# Patient Record
Sex: Male | Born: 1990 | Race: White | Hispanic: No | Marital: Married | Smoking: Never smoker
Health system: Southern US, Community
[De-identification: ages and names within clinical notes are randomized; demographics above are authoritative.]

## PROBLEM LIST (undated history)

## (undated) DIAGNOSIS — Z8489 Family history of other specified conditions: Secondary | ICD-10-CM

## (undated) DIAGNOSIS — K219 Gastro-esophageal reflux disease without esophagitis: Secondary | ICD-10-CM

---
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
## 2013-04-05 ENCOUNTER — Ambulatory Visit: Payer: Self-pay | Admitting: Family Medicine

## 2016-04-24 DIAGNOSIS — E669 Obesity, unspecified: Secondary | ICD-10-CM

## 2016-04-24 DIAGNOSIS — J302 Other seasonal allergic rhinitis: Secondary | ICD-10-CM | POA: Insufficient documentation

## 2016-04-25 ENCOUNTER — Ambulatory Visit (INDEPENDENT_AMBULATORY_CARE_PROVIDER_SITE_OTHER): Payer: Managed Care, Other (non HMO) | Admitting: Family Medicine

## 2016-04-25 VITALS — BP 120/62 | HR 68 | Temp 98.5°F | Wt 221.0 lb

## 2016-04-25 DIAGNOSIS — K4090 Unilateral inguinal hernia, without obstruction or gangrene, not specified as recurrent: Secondary | ICD-10-CM | POA: Diagnosis not present

## 2016-04-25 NOTE — Progress Notes (Signed)
   Aaron Hurst.  MRN: [id number] DOB: 1991-06-15  Subjective:  HPI  The patient is a 25 year old male who presents with complaint of left groin pain.  He reports that he had been working out at [employer] and doing a lot of abdominal exercises.  He began having pain in the left groin about 2 weeks ago and he notes that there is a little lump that is reducable.  There is minor pain and occasional moderate amount of pain.    The patient is to get married in November and plans on going to [place].  Because of this he wanted to make sure everything was ok and if he needed to have anything done he could have it done in time for the wedding.  Patient Active Problem List   Diagnosis Date Noted  . Seasonal allergies 04/24/2016  . Obesity 04/24/2016    No past medical history on file.  Social History   Social History  . Marital status: Single    Spouse name: N/A  . Number of children: N/A  . Years of education: N/A   Occupational History  . Not on file.   Social History Main Topics  . Smoking status: Not on file  . Smokeless tobacco: Not on file  . Alcohol use Not on file  . Drug use: Unknown  . Sexual activity: Not on file   Other Topics Concern  . Not on file   Social History Narrative  . No narrative on file    Outpatient Medications Prior to Visit  Medication Sig Dispense Refill  . fexofenadine (ALLEGRA) 60 MG tablet Take 60 mg by mouth daily.    . Multiple Vitamin (MULTIVITAMIN) tablet Take 1 tablet by mouth daily.    . naproxen (NAPROSYN) 500 MG tablet Take 500 mg by mouth 2 (two) times daily with a meal.    . Omega-3 Fatty Acids (FISH OIL) 1000 MG CAPS Take 1 capsule by mouth daily.     No facility-administered medications prior to visit.     No Known Allergies  Review of Systems  Constitutional: Negative for fever and malaise/fatigue.  Gastrointestinal: Negative for abdominal pain, blood in stool, constipation, diarrhea, heartburn, nausea and vomiting.    Genitourinary: Negative for dysuria (patient states that the area is a little more sensitive sometimes when he urinates).  Neurological: Negative for dizziness, weakness and headaches.   Objective:  BP 120/62   Pulse 68   Temp 98.5 F (36.9 C) (Oral)   Wt 221 lb (100.2 kg)   BMI 29.97 kg/m   Physical Exam  Constitutional: He is oriented to person, place, and time and well-developed, well-nourished, and in no distress.  HENT:  Head: Normocephalic and atraumatic.  Right Ear: External ear normal.  Left Ear: External ear normal.  Nose: Nose normal.  Eyes: Conjunctivae are normal.  Cardiovascular: Normal rate, regular rhythm and normal heart sounds.   Pulmonary/Chest: Effort normal and breath sounds normal.  Abdominal: Soft.  Neurological: He is alert and oriented to person, place, and time.  Skin: Skin is warm and dry.  Psychiatric: Mood, memory, affect and judgment normal.    Assessment and Plan :   Left inguinal hernia Refer to surgery discuss options. Julieanne Manson MD [facility] 04/25/2016 4:08 PM

## 2016-04-26 ENCOUNTER — Encounter: Payer: Self-pay | Admitting: *Deleted

## 2016-05-14 ENCOUNTER — Encounter: Payer: Self-pay | Admitting: General Surgery

## 2016-05-14 ENCOUNTER — Ambulatory Visit (INDEPENDENT_AMBULATORY_CARE_PROVIDER_SITE_OTHER): Payer: Managed Care, Other (non HMO) | Admitting: General Surgery

## 2016-05-14 VITALS — BP 124/80 | HR 82 | Resp 14 | Ht 72.0 in | Wt 224.0 lb

## 2016-05-14 DIAGNOSIS — K4090 Unilateral inguinal hernia, without obstruction or gangrene, not specified as recurrent: Secondary | ICD-10-CM | POA: Diagnosis not present

## 2016-05-14 NOTE — Progress Notes (Signed)
Patient ID: Aaron PrettyStephen C Rickey Jr., male   DOB: Jan 18, 1991, 25 y.o.   MRN: [id number]  Chief Complaint  Patient presents with  . Other    left inguinal hernia    HPI Aaron PrettyStephen C Kovacik Jr. is a 25 y.o. male here today for a evaluation of a left inguinal hernia repair. He states he noticed this area about 6 weeks ago. He states the area is popping in and out. Some pain with activity. He states he was working out at [employer] when this happened.  I have reviewed the history of present illness with the patient.  HPI  History reviewed. No pertinent past medical history.  Past Surgical History:  Procedure Laterality Date  . NO PAST SURGERIES    . WISDOM TOOTH EXTRACTION  2009    Family History  Problem Relation Age of Onset  . Colon cancer Father   . Sleep apnea Father   . Heart attack Paternal Grandfather     Social History Social History  Substance Use Topics  . Smoking status: Never Smoker  . Smokeless tobacco: Never Used  . Alcohol use Yes    No Known Allergies  Current Outpatient Prescriptions  Medication Sig Dispense Refill  . ibuprofen (ADVIL,MOTRIN) 100 MG tablet Take 100 mg by mouth every 6 (six) hours as needed for fever.    Marland Kitchen. MELATON-5 HTP-TRYPTOPHAN-B6-MG PO Take by mouth.    . Multiple Vitamins-Minerals (MULTIVITAMIN WITH MINERALS) tablet Take 1 tablet by mouth daily.    . Omega-3 Fatty Acids (FISH OIL) 1000 MG CAPS Take by mouth.     No current facility-administered medications for this visit.     Review of Systems Review of Systems  Constitutional: Negative.   Respiratory: Negative.   Cardiovascular: Negative.     Blood pressure 124/80, pulse 82, resp. rate 14, height 6' (1.829 m), weight 224 lb (101.6 kg).  Physical Exam Physical Exam  Constitutional: He is oriented to person, place, and time. He appears well-developed and well-nourished.  Eyes: Conjunctivae are normal.  Neck: Neck supple.  Cardiovascular: Normal rate, regular rhythm and normal heart  sounds.   Pulmonary/Chest: Effort normal and breath sounds normal.  Abdominal: Soft. Normal appearance and bowel sounds are normal. There is no hepatomegaly. There is no tenderness. A hernia is present. Hernia confirmed positive in the left inguinal area (medium sized reducible hernia). Hernia confirmed negative in the right inguinal area.  Lymphadenopathy:    He has no cervical adenopathy.  Neurological: He is alert and oriented to person, place, and time.  Skin: Skin is warm and dry.    Data Reviewed Notes reviewed   Assessment    Left inguinal henria    Plan   Recommended repair. Explained both open and laparoscopic approaches and use of mesh. He is agreeable. Plan for laparoscopic procedure    Hernia precautions and incarceration were discussed with the patient. If they develop symptoms of an/ incarcerated hernia, they were encouraged to seek prompt medical attention.  I have recommended repair of the hernia using mesh on an outpatient basis in the near future. The risk of infection was reviewed. The role of prosthetic mesh to minimize the risk of recurrence was reviewed.  The patient is scheduled for surgery at [facility] on 05/30/16. He will pre admit by phone. The patient is aware of date and instructions.   This information has been scribed by Ples SpecterJessica Qualls CMA.   SANKAR,SEEPLAPUTHUR G 05/14/2016, 4:24 PM

## 2016-05-14 NOTE — Patient Instructions (Addendum)

## 2016-05-21 ENCOUNTER — Encounter
Admission: RE | Admit: 2016-05-21 | Discharge: 2016-05-21 | Disposition: A | Discharge status: Home / Self Care | Payer: Managed Care, Other (non HMO) | Source: Ambulatory Visit | Attending: General Surgery | Admitting: General Surgery

## 2016-05-21 HISTORY — DX: Gastro-esophageal reflux disease without esophagitis: K21.9

## 2016-05-21 HISTORY — DX: Family history of other specified conditions: Z84.89

## 2016-05-21 NOTE — Patient Instructions (Signed)
  Your procedure is scheduled on: 05-30-16 Report to Same Day Surgery 2nd floor medical mall To find out your arrival time please call [phone] between 1PM - 3PM on 05-29-16  Remember: Instructions that are not followed completely may result in serious medical risk, up to and including death, or upon the discretion of your surgeon and anesthesiologist your surgery may need to be rescheduled.    _x___ 1. Do not eat food or drink liquids after midnight. No gum chewing or hard candies.     __x__ 2. No Alcohol for 24 hours before or after surgery.   __x__3. No Smoking for 24 prior to surgery.   ____  4. Bring all medications with you on the day of surgery if instructed.    __x__ 5. Notify your doctor if there is any change in your medical condition     (cold, fever, infections).     Do not wear jewelry, make-up, hairpins, clips or nail polish.  Do not wear lotions, powders, or perfumes. You may wear deodorant.  Do not shave 48 hours prior to surgery. Men may shave face and neck.  Do not bring valuables to the hospital.    [facility] is not responsible for any belongings or valuables.               Contacts, dentures or bridgework may not be worn into surgery.  Leave your suitcase in the car. After surgery it may be brought to your room.  For patients admitted to the hospital, discharge time is determined by your treatment team.   Patients discharged the day of surgery will not be allowed to drive home.    Please read over the following fact sheets that you were given:   [facility] Preparing for Surgery and or MRSA Information   ____ Take these medicines the morning of surgery with A SIP OF WATER:    1. NONE  2.  3.  4.  5.  6.  ____ Fleet Enema (as directed)   ____ Use CHG Soap or sage wipes as directed on instruction sheet   ____ Use inhalers on the day of surgery and bring to hospital day of surgery  ____ Stop metformin 2 days prior to surgery    ____ Take 1/2 of  usual insulin dose the night before surgery and none on the morning of surgery.   ____ Stop aspirin or coumadin, or plavix  _x__ Stop Anti-inflammatories such as Advil, Aleve, Ibuprofen, Motrin, Naproxen,          Naprosyn, Goodies powders or aspirin products-STOP 7 DAYS PRIOR TO SURGERY. Ok to take Tylenol.   _X___ Stop supplements until after surgery-STOP MELATONIN, FISH OIL, AND WORKOUT POWDER 7 DAYS PRIOR TO SURGERY    ____ Bring C-Pap to the hospital.

## 2016-05-22 ENCOUNTER — Ambulatory Visit: Payer: Self-pay | Admitting: General Surgery

## 2016-05-28 ENCOUNTER — Encounter: Payer: Self-pay | Admitting: *Deleted

## 2016-05-30 ENCOUNTER — Encounter
Admission: RE | Disposition: A | Discharge status: Home / Self Care | Payer: Self-pay | Source: Ambulatory Visit | Attending: General Surgery

## 2016-05-30 ENCOUNTER — Encounter: Payer: Self-pay | Admitting: *Deleted

## 2016-05-30 ENCOUNTER — Ambulatory Visit: Payer: Managed Care, Other (non HMO) | Admitting: Anesthesiology

## 2016-05-30 ENCOUNTER — Ambulatory Visit
Admission: RE | Admit: 2016-05-30 | Discharge: 2016-05-30 | Disposition: A | Discharge status: Home / Self Care | Payer: Managed Care, Other (non HMO) | Source: Ambulatory Visit | Attending: General Surgery | Admitting: General Surgery

## 2016-05-30 DIAGNOSIS — K4090 Unilateral inguinal hernia, without obstruction or gangrene, not specified as recurrent: Secondary | ICD-10-CM | POA: Insufficient documentation

## 2016-05-30 DIAGNOSIS — Z8489 Family history of other specified conditions: Secondary | ICD-10-CM | POA: Diagnosis not present

## 2016-05-30 DIAGNOSIS — Z800 Family history of malignant neoplasm of digestive organs: Secondary | ICD-10-CM | POA: Insufficient documentation

## 2016-05-30 DIAGNOSIS — K219 Gastro-esophageal reflux disease without esophagitis: Secondary | ICD-10-CM | POA: Diagnosis not present

## 2016-05-30 DIAGNOSIS — Z8249 Family history of ischemic heart disease and other diseases of the circulatory system: Secondary | ICD-10-CM | POA: Diagnosis not present

## 2016-05-30 DIAGNOSIS — Z79899 Other long term (current) drug therapy: Secondary | ICD-10-CM | POA: Insufficient documentation

## 2016-05-30 HISTORY — PX: INGUINAL HERNIA REPAIR: SHX194

## 2016-05-30 SURGERY — REPAIR, HERNIA, INGUINAL, LAPAROSCOPIC
Anesthesia: General | Laterality: Left | Wound class: Clean

## 2016-05-30 MED ORDER — SUGAMMADEX SODIUM 200 MG/2ML IV SOLN
INTRAVENOUS | Status: DC | PRN
  Administered 2016-05-30: 200 mg via INTRAVENOUS

## 2016-05-30 MED ORDER — MIDAZOLAM HCL 2 MG/2ML IJ SOLN
INTRAMUSCULAR | Status: DC | PRN
  Administered 2016-05-30: 2 mg via INTRAVENOUS

## 2016-05-30 MED ORDER — OXYCODONE-ACETAMINOPHEN 5-325 MG PO TABS
1.0000 | ORAL_TABLET | ORAL | Status: DC | PRN
  Administered 2016-05-30: 1 via ORAL

## 2016-05-30 MED ORDER — FENTANYL CITRATE (PF) 100 MCG/2ML IJ SOLN
25.0000 ug | INTRAMUSCULAR | Status: DC | PRN
  Administered 2016-05-30 (×4): 25 ug via INTRAVENOUS

## 2016-05-30 MED ORDER — DEXAMETHASONE SODIUM PHOSPHATE 10 MG/ML IJ SOLN
INTRAMUSCULAR | Status: DC | PRN
  Administered 2016-05-30: 10 mg via INTRAVENOUS

## 2016-05-30 MED ORDER — PROPOFOL 10 MG/ML IV BOLUS
INTRAVENOUS | Status: DC | PRN
  Administered 2016-05-30: 200 mg via INTRAVENOUS

## 2016-05-30 MED ORDER — OXYCODONE-ACETAMINOPHEN 5-325 MG PO TABS: 1.0000 | ORAL_TABLET | ORAL | 0 refills | Status: DC | PRN

## 2016-05-30 MED ORDER — ONDANSETRON HCL 4 MG/2ML IJ SOLN
INTRAMUSCULAR | Status: DC | PRN
  Administered 2016-05-30: 4 mg via INTRAVENOUS

## 2016-05-30 MED ORDER — FAMOTIDINE 20 MG PO TABS: 20.0000 mg | ORAL_TABLET | Freq: Once | ORAL | Status: DC

## 2016-05-30 MED ORDER — CEFAZOLIN SODIUM-DEXTROSE 2-4 GM/100ML-% IV SOLN
INTRAVENOUS | Status: AC
  Filled 2016-05-30: qty 100

## 2016-05-30 MED ORDER — FAMOTIDINE 20 MG PO TABS
ORAL_TABLET | ORAL | Status: AC
  Administered 2016-05-30: 20 mg
  Filled 2016-05-30: qty 1

## 2016-05-30 MED ORDER — OXYCODONE-ACETAMINOPHEN 5-325 MG PO TABS
ORAL_TABLET | ORAL | Status: DC
Start: 2016-05-30 — End: 2016-05-30
  Filled 2016-05-30: qty 1

## 2016-05-30 MED ORDER — ACETAMINOPHEN 10 MG/ML IV SOLN
INTRAVENOUS | Status: AC
  Filled 2016-05-30: qty 100

## 2016-05-30 MED ORDER — FENTANYL CITRATE (PF) 100 MCG/2ML IJ SOLN
INTRAMUSCULAR | Status: AC
  Administered 2016-05-30: 25 ug via INTRAVENOUS
  Filled 2016-05-30: qty 2

## 2016-05-30 MED ORDER — CEFAZOLIN SODIUM-DEXTROSE 2-4 GM/100ML-% IV SOLN
2.0000 g | INTRAVENOUS | Status: AC
  Administered 2016-05-30: 2 g via INTRAVENOUS

## 2016-05-30 MED ORDER — CHLORHEXIDINE GLUCONATE CLOTH 2 % EX PADS
6.0000 | MEDICATED_PAD | Freq: Once | CUTANEOUS | Status: DC
Start: 2016-05-30 — End: 2016-05-30

## 2016-05-30 MED ORDER — LIDOCAINE HCL (CARDIAC) 20 MG/ML IV SOLN
INTRAVENOUS | Status: DC | PRN
  Administered 2016-05-30: 40 mg via INTRAVENOUS

## 2016-05-30 MED ORDER — LACTATED RINGERS IV SOLN
INTRAVENOUS | Status: DC
  Administered 2016-05-30 (×2): via INTRAVENOUS

## 2016-05-30 MED ORDER — ONDANSETRON HCL 4 MG/2ML IJ SOLN: 4.0000 mg | Freq: Once | INTRAMUSCULAR | Status: DC | PRN

## 2016-05-30 MED ORDER — ROCURONIUM BROMIDE 100 MG/10ML IV SOLN
INTRAVENOUS | Status: DC | PRN
  Administered 2016-05-30 (×2): 10 mg via INTRAVENOUS
  Administered 2016-05-30: 30 mg via INTRAVENOUS

## 2016-05-30 MED ORDER — FENTANYL CITRATE (PF) 100 MCG/2ML IJ SOLN
INTRAMUSCULAR | Status: DC | PRN
  Administered 2016-05-30 (×2): 100 ug via INTRAVENOUS

## 2016-05-30 MED ORDER — ACETAMINOPHEN 10 MG/ML IV SOLN
INTRAVENOUS | Status: DC | PRN
  Administered 2016-05-30: 1000 mg via INTRAVENOUS

## 2016-05-30 SURGICAL SUPPLY — 33 items
BLADE CLIPPER SURG (BLADE) ×3 IMPLANT
BLADE SURG 11 STRL SS SAFETY (MISCELLANEOUS) ×3 IMPLANT
CANISTER SUCT 1200ML W/VALVE (MISCELLANEOUS) ×3 IMPLANT
CATH TRAY 16F METER LATEX (MISCELLANEOUS) ×3 IMPLANT
CHLORAPREP W/TINT 26ML (MISCELLANEOUS) ×3 IMPLANT
DEFOGGER SCOPE WARMER CLEARIFY (MISCELLANEOUS) ×3 IMPLANT
DERMABOND ADVANCED (GAUZE/BANDAGES/DRESSINGS) ×2
DERMABOND ADVANCED .7 DNX12 (GAUZE/BANDAGES/DRESSINGS) ×1 IMPLANT
DEVICE SECURE STRAP 25 ABSORB (INSTRUMENTS) ×3 IMPLANT
DRAPE INCISE IOBAN 66X45 STRL (DRAPES) ×3 IMPLANT
ELECT REM PT RETURN 9FT ADLT (ELECTROSURGICAL) ×3
ELECTRODE REM PT RTRN 9FT ADLT (ELECTROSURGICAL) ×1 IMPLANT
GLOVE BIO SURGEON STRL SZ7 (GLOVE) ×18 IMPLANT
GOWN STRL REUS W/ TWL LRG LVL3 (GOWN DISPOSABLE) ×3 IMPLANT
GOWN STRL REUS W/TWL LRG LVL3 (GOWN DISPOSABLE) ×6
GRASPER SUT TROCAR 14GX15 (MISCELLANEOUS) ×3 IMPLANT
IRRIGATION STRYKERFLOW (MISCELLANEOUS) IMPLANT
IRRIGATOR STRYKERFLOW (MISCELLANEOUS)
IV LACTATED RINGERS 1000ML (IV SOLUTION) IMPLANT
KIT RM TURNOVER STRD PROC AR (KITS) ×3 IMPLANT
LABEL OR SOLS (LABEL) ×3 IMPLANT
LIQUID BAND (GAUZE/BANDAGES/DRESSINGS) IMPLANT
MESH 3DMAX 3X5 LT MED (Mesh General) ×3 IMPLANT
NEEDLE VERESS 14GA 120MM (NEEDLE) ×3 IMPLANT
PACK LAP CHOLECYSTECTOMY (MISCELLANEOUS) ×3 IMPLANT
SCISSORS METZENBAUM CVD 33 (INSTRUMENTS) ×3 IMPLANT
SHEARS HARMONIC ACE PLUS 36CM (ENDOMECHANICALS) IMPLANT
SLEEVE ENDOPATH XCEL 5M (ENDOMECHANICALS) ×3 IMPLANT
SUT VIC AB 0 CT2 27 (SUTURE) ×3 IMPLANT
SUT VIC AB 4-0 FS2 27 (SUTURE) ×3 IMPLANT
TROCAR XCEL NON-BLD 11X100MML (ENDOMECHANICALS) ×3 IMPLANT
TROCAR XCEL NON-BLD 5MMX100MML (ENDOMECHANICALS) ×3 IMPLANT
TUBING INSUFFLATOR HI FLOW (MISCELLANEOUS) ×3 IMPLANT

## 2016-05-30 NOTE — Anesthesia Procedure Notes (Signed)
Procedure Name: Intubation Date/Time: 05/30/2016 7:25 AM Performed by: Henrietta HooverPOPE, Starlett Pehrson Pre-anesthesia Checklist: Patient identified, Emergency Drugs available, Suction available, Patient being monitored and Timeout performed Patient Re-evaluated:Patient Re-evaluated prior to inductionOxygen Delivery Method: Circle system utilized Preoxygenation: Pre-oxygenation with 100% oxygen Intubation Type: IV induction Ventilation: Mask ventilation without difficulty Laryngoscope Size: Mac and 3 Grade View: Grade I Tube type: Oral Tube size: 7.5 mm Number of attempts: 1 Placement Confirmation: ETT inserted through vocal cords under direct vision,  positive ETCO2 and breath sounds checked- equal and bilateral Secured at: 22 cm Tube secured with: Tape Dental Injury: Teeth and Oropharynx as per pre-operative assessment

## 2016-05-30 NOTE — Interval H&P Note (Signed)
History and Physical Interval Note:  05/30/2016 7:07 AM  Aaron PrettyStephen C Schroeter Jr.  has presented today for surgery, with the diagnosis of left inguinal hernia  The various methods of treatment have been discussed with the patient and family. After consideration of risks, benefits and other options for treatment, the patient has consented to  Procedure(s): LAPAROSCOPIC INGUINAL HERNIA (Left) as a surgical intervention .  The patient's history has been reviewed, patient examined, no change in status, stable for surgery.  I have reviewed the patient's chart and labs.  Questions were answered to the patient's satisfaction.     Maurissa Ambrose G

## 2016-05-30 NOTE — Transfer of Care (Signed)
Immediate Anesthesia Transfer of Care Note  Patient: Deland PrettyStephen C Mule Jr.  Procedure(s) Performed: Procedure(s): LAPAROSCOPIC INGUINAL HERNIA (Left)  Patient Location: PACU  Anesthesia Type:General  Level of Consciousness: awake  Airway & Oxygen Therapy: Patient Spontanous Breathing and Patient connected to face mask oxygen  Post-op Assessment: Report given to RN and Post -op Vital signs reviewed and stable  Post vital signs: Reviewed and stable  Last Vitals:  Vitals:   05/30/16 0625 05/30/16 0858  BP: 134/79   Pulse: 82   Resp: 14   Temp: 36.7 C (P) 36.8 C    Last Pain:  Vitals:   05/30/16 0625  TempSrc: Oral         Complications: No apparent anesthesia complications

## 2016-05-30 NOTE — Anesthesia Postprocedure Evaluation (Signed)
Anesthesia Post Note  Patient: Aaron PrettyStephen C Benedetti Jr.  Procedure(s) Performed: Procedure(s) (LRB): LAPAROSCOPIC INGUINAL HERNIA (Left)  Patient location during evaluation: PACU Anesthesia Type: General Level of consciousness: awake and alert Pain management: pain level controlled Vital Signs Assessment: post-procedure vital signs reviewed and stable Respiratory status: spontaneous breathing and respiratory function stable Cardiovascular status: stable Anesthetic complications: no    Last Vitals:  Vitals:   05/30/16 0939 05/30/16 0944  BP:  140/86  Pulse: 69 88  Resp: 16 14  Temp:  36.3 C    Last Pain:  Vitals:   05/30/16 0944  TempSrc:   PainSc: 3                  [name] K

## 2016-05-30 NOTE — Discharge Instructions (Signed)

## 2016-05-30 NOTE — H&P (View-Only) (Signed)
Patient ID: Aaron C Dom Jr., male   DOB: 10/05/1990, 25 y.o.   MRN: [id number]  Chief Complaint  Patient presents with  . Other    left inguinal hernia    HPI Aaron C Odom Jr. is a 25 y.o. male here today for a evaluation of a left inguinal hernia repair. He states he noticed this area about 6 weeks ago. He states the area is popping in and out. Some pain with activity. He states he was working out at [employer] when this happened.  I have reviewed the history of present illness with the patient.  HPI  History reviewed. No pertinent past medical history.  Past Surgical History:  Procedure Laterality Date  . NO PAST SURGERIES    . WISDOM TOOTH EXTRACTION  2009    Family History  Problem Relation Age of Onset  . Colon cancer Father   . Sleep apnea Father   . Heart attack Paternal Grandfather     Social History Social History  Substance Use Topics  . Smoking status: Never Smoker  . Smokeless tobacco: Never Used  . Alcohol use Yes    No Known Allergies  Current Outpatient Prescriptions  Medication Sig Dispense Refill  . ibuprofen (ADVIL,MOTRIN) 100 MG tablet Take 100 mg by mouth every 6 (six) hours as needed for fever.    . MELATON-5 HTP-TRYPTOPHAN-B6-MG PO Take by mouth.    . Multiple Vitamins-Minerals (MULTIVITAMIN WITH MINERALS) tablet Take 1 tablet by mouth daily.    . Omega-3 Fatty Acids (FISH OIL) 1000 MG CAPS Take by mouth.     No current facility-administered medications for this visit.     Review of Systems Review of Systems  Constitutional: Negative.   Respiratory: Negative.   Cardiovascular: Negative.     Blood pressure 124/80, pulse 82, resp. rate 14, height 6' (1.829 m), weight 224 lb (101.6 kg).  Physical Exam Physical Exam  Constitutional: He is oriented to person, place, and time. He appears well-developed and well-nourished.  Eyes: Conjunctivae are normal.  Neck: Neck supple.  Cardiovascular: Normal rate, regular rhythm and normal heart  sounds.   Pulmonary/Chest: Effort normal and breath sounds normal.  Abdominal: Soft. Normal appearance and bowel sounds are normal. There is no hepatomegaly. There is no tenderness. A hernia is present. Hernia confirmed positive in the left inguinal area (medium sized reducible hernia). Hernia confirmed negative in the right inguinal area.  Lymphadenopathy:    He has no cervical adenopathy.  Neurological: He is alert and oriented to person, place, and time.  Skin: Skin is warm and dry.    Data Reviewed Notes reviewed   Assessment    Left inguinal henria    Plan   Recommended repair. Explained both open and laparoscopic approaches and use of mesh. He is agreeable. Plan for laparoscopic procedure    Hernia precautions and incarceration were discussed with the patient. If they develop symptoms of an/ incarcerated hernia, they were encouraged to seek prompt medical attention.  I have recommended repair of the hernia using mesh on an outpatient basis in the near future. The risk of infection was reviewed. The role of prosthetic mesh to minimize the risk of recurrence was reviewed.  The patient is scheduled for surgery at [facility] on 05/30/16. He will pre admit by phone. The patient is aware of date and instructions.   This information has been scribed by Jessica Qualls CMA.   Jelene Albano G 05/14/2016, 4:24 PM   

## 2016-05-30 NOTE — Anesthesia Preprocedure Evaluation (Signed)
Anesthesia Evaluation  Patient identified by MRN, date of birth, ID band Patient awake    Reviewed: Allergy & Precautions, NPO status , Patient's Chart, lab work & pertinent test results  History of Anesthesia Complications (+) Family history of anesthesia reactionNegative for: history of anesthetic complications (father with PONV)  Airway Mallampati: I       Dental   Pulmonary neg pulmonary ROS, former smoker,           Cardiovascular negative cardio ROS       Neuro/Psych negative neurological ROS     GI/Hepatic Neg liver ROS, GERD  ,  Endo/Other  negative endocrine ROS  Renal/GU negative Renal ROS     Musculoskeletal   Abdominal   Peds  Hematology negative hematology ROS (+)   Anesthesia Other Findings   Reproductive/Obstetrics                             Anesthesia Physical Anesthesia Plan  ASA: II  Anesthesia Plan: General   Post-op Pain Management:    Induction: Intravenous  Airway Management Planned: LMA  Additional Equipment:   Intra-op Plan:   Post-operative Plan:   Informed Consent: I have reviewed the patients History and Physical, chart, labs and discussed the procedure including the risks, benefits and alternatives for the proposed anesthesia with the patient or authorized representative who has indicated his/her understanding and acceptance.     Plan Discussed with:   Anesthesia Plan Comments:         Anesthesia Quick Evaluation

## 2016-05-31 NOTE — Op Note (Signed)
Preop diagnosis: Left inguinal hernia  Post op diagnosis: Left inguinal hernia indirect  Operation: Laparoscopy repair left inguinal hernia  Surgeon: Kathreen CosierS. G. Shacoya Burkhammer R  Assistant:     Anesthesia: Gen.  Complications: None  EBL: Less than 10 mL  Drains: None  Description: Patient was put to sleep in supine position and Foley catheter was inserted this catheter was removed at the end of the procedure. The abdomen was prepped and draped as sterile field and timeout performed. Small incision was made along the upper lip of the umbilicus and a Veress needle was positioned the peritoneal cavity and verified of the hanging drop method. Pneumoperitoneum was obtained followed by placement of 11 mm port. The camera in place he was noted that the patient had no hernia on the right side but had an indirect hernia on the left with tape portion of the appendices from the sigmoid going into it and being adherent to the sac. On the left side 25 mm ports were placed. The adhesion of the of the herniated fat was taken down with the use of cautery. The peritoneum superior to the hernia was incised from the lateral to medial aspect using cautery. And thereafter the preperitoneal space behind the inguinal canal was dissected. The pubic tubercle and inguinal ligament were outlined. The inferior epigastric artery was identified. The sac was then carefully freed from cord structures from the inguinal region and back towards the abdominal cavity. After this was bluntly freed the cord was inspected and the vas in the other structures aren't noted be intact. A Bard 3-D mesh was then positioned in the peritoneal cavity and laid against the posterior wall of the inguinal canal. It was then tacked to the pubic tubercle and the inguinal ligament below and a few along the medial and superior edges with the secure strap. The peritoneum was then reapproximated to cover the mesh also using the secure strap. The ports were then removed  and pneumoperitoneum was released. The fascial opening at the umbilicus was closed with interrupted sutures of  0 Vicryl. Skin incisions were then closed with subcuticular 4-0 Vicryl and covered with Dermabond. Patient tolerated the procedure well with no immediate problems encountered and was subsequently extubated and returned recovery room in stable condition.

## 2016-06-03 ENCOUNTER — Telehealth: Payer: Self-pay | Admitting: *Deleted

## 2016-06-03 NOTE — Telephone Encounter (Signed)
Spoke with patient and assured him that this was normal. He is using ice to the area he was just concerned.

## 2016-06-03 NOTE — Telephone Encounter (Signed)
Patient had left inguinal hernia surgery by Dr.Sankar on 05/30/16 and is now experiencing some pain in the left testicle area and its a little swollen and tender. He wants to know is this normal after surgery.

## 2016-06-06 ENCOUNTER — Encounter: Payer: Self-pay | Admitting: General Surgery

## 2016-06-06 ENCOUNTER — Ambulatory Visit (INDEPENDENT_AMBULATORY_CARE_PROVIDER_SITE_OTHER): Payer: Managed Care, Other (non HMO) | Admitting: General Surgery

## 2016-06-06 VITALS — BP 106/58 | HR 68 | Resp 12 | Ht 72.0 in | Wt 212.0 lb

## 2016-06-06 DIAGNOSIS — K4090 Unilateral inguinal hernia, without obstruction or gangrene, not specified as recurrent: Secondary | ICD-10-CM

## 2016-06-06 NOTE — Patient Instructions (Addendum)
The patient is aware to call back for any questions or concerns.    Proper lifting techniques reviewed. Resume activities as tolerated. 

## 2016-06-06 NOTE — Progress Notes (Signed)
Patient ID: Aaron PrettyStephen C Lautner Jr., male   DOB: 14-Oct-1990, 25 y.o.   MRN: [id number]  Chief Complaint  Patient presents with  . Routine Post Op    HPI Aaron PrettyStephen C Ozimek Jr. is a 25 y.o. male here today for postoperative visit after left inguinal hernia repair. He states he is doing well, just a little sore. He has been having regular bowel movements.  I have reviewed the history of present illness with the patient.  HPI  Past Medical History:  Diagnosis Date  . Family history of adverse reaction to anesthesia    DAD-N/V  . GERD (gastroesophageal reflux disease)    RARE-TUMS PRN    Past Surgical History:  Procedure Laterality Date  . INGUINAL HERNIA REPAIR Left 05/30/2016   Procedure: LAPAROSCOPIC INGUINAL HERNIA;  Surgeon: Kieth BrightlySeeplaputhur G Sankar, MD;  Location: [facility] ORS;  Service: General;  Laterality: Left;  . WISDOM TOOTH EXTRACTION  2009    Family History  Problem Relation Age of Onset  . Colon cancer Father   . Sleep apnea Father   . Heart attack Paternal Grandfather     Social History Social History  Substance Use Topics  . Smoking status: Former Smoker    Types: Cigarettes  . Smokeless tobacco: Never Used     Comment: SOCIALLY  . Alcohol use Yes     Comment: 1-2  BEER WEEKLY    No Known Allergies  Current Outpatient Prescriptions  Medication Sig Dispense Refill  . acetaminophen (TYLENOL) 325 MG tablet Take 650 mg by mouth every 6 (six) hours as needed.    Marland Kitchen. ibuprofen (ADVIL,MOTRIN) 100 MG tablet Take 100 mg by mouth every 6 (six) hours as needed for fever.    . Melatonin 5 MG CAPS Take 1 capsule by mouth as needed.    . Multiple Vitamins-Minerals (MULTIVITAMIN WITH MINERALS) tablet Take 1 tablet by mouth daily.    . Omega-3 Fatty Acids (FISH OIL) 1000 MG CAPS Take by mouth.    Marland Kitchen. OVER THE COUNTER MEDICATION Take 1 Dose by mouth daily. PRE-WORKOUT POWDER DAILY    . oxyCODONE-acetaminophen (ROXICET) 5-325 MG tablet Take 1 tablet by mouth every 4 (four) hours as  needed. 30 tablet 0   No current facility-administered medications for this visit.     Review of Systems Review of Systems  Constitutional: Negative.   Respiratory: Negative.   Cardiovascular: Negative.   Gastrointestinal: Negative.     Blood pressure (!) 106/58, pulse 68, resp. rate 12, height 6' (1.829 m), weight 212 lb (96.2 kg).  Physical Exam Physical Exam  Constitutional: He is oriented to person, place, and time. He appears well-developed and well-nourished.  Abdominal: Soft. Normal appearance and bowel sounds are normal. There is no tenderness. No hernia. Hernia confirmed negative in the right inguinal area and confirmed negative in the left inguinal area.  Hernia repair intact, port sites clean  Neurological: He is alert and oriented to person, place, and time.  Skin: Skin is warm and dry.  Psychiatric: His behavior is normal.    Data Reviewed Prior notes  Assessment    Stable postoperative exam. Left inguinal hernia, repaired.    Plan    Follow up 4-5 weeks. Proper lifting techniques reviewed. Resume activities as tolerated.  This information has been scribed by Dorathy DaftMarsha Hatch RN, BSN,BC.      SANKAR,SEEPLAPUTHUR G 06/06/2016, 11:17 AM

## 2016-06-13 ENCOUNTER — Ambulatory Visit (INDEPENDENT_AMBULATORY_CARE_PROVIDER_SITE_OTHER): Payer: Managed Care, Other (non HMO) | Admitting: Family Medicine

## 2016-06-13 ENCOUNTER — Ambulatory Visit
Admission: RE | Admit: 2016-06-13 | Discharge: 2016-06-13 | Disposition: A | Discharge status: Home / Self Care | Payer: Managed Care, Other (non HMO) | Source: Ambulatory Visit | Attending: Family Medicine | Admitting: Family Medicine

## 2016-06-13 ENCOUNTER — Encounter: Payer: Self-pay | Admitting: Family Medicine

## 2016-06-13 ENCOUNTER — Encounter: Payer: Self-pay | Admitting: General Surgery

## 2016-06-13 VITALS — BP 104/60 | HR 82 | Temp 98.8°F | Resp 16 | Ht 72.0 in | Wt 217.0 lb

## 2016-06-13 DIAGNOSIS — M79641 Pain in right hand: Secondary | ICD-10-CM

## 2016-06-13 DIAGNOSIS — Z23 Encounter for immunization: Secondary | ICD-10-CM | POA: Diagnosis not present

## 2016-06-13 NOTE — Patient Instructions (Signed)
Will call you with the x-ray report.

## 2016-06-13 NOTE — Progress Notes (Addendum)
Subjective:     Patient ID: Deland PrettyStephen C Buchan Jr., male   DOB: 1991-01-11, 25 y.o.   MRN: [id number]  HPI  Chief Complaint  Patient presents with  . 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 Believes he may have struck his right hand on a metal door knob  prior to the onset of his symptoms which have persisted for 6  weeks. Reports pain in his right fifth metacarpal area whenever he shakes hands or has pressure applied there. Has not impaired his ability to work. Has tried Advil, Tylenol and Percocet without resolution   Review of Systems     Objective:   Physical Exam  Constitutional: He appears well-developed and well-nourished. No distress.  Musculoskeletal:  Mild tenderness over his proximal right fifth MCP. No deformity or ecchymosis. Grip/WF/WE: 5/5       Assessment:    1. Hand pain, right - DG Hand Complete Right; Future  2. Need for diphtheria-tetanus-pertussis (Tdap) vaccine - Tdap vaccine greater than or equal to 7yo IM  3. Need for influenza vaccination - Flu Vaccine QUAD 36+ mos IM    Plan:    Further f/u pending x-ray report. Refer to orthopedics.

## 2016-06-14 ENCOUNTER — Other Ambulatory Visit: Payer: Self-pay | Admitting: Family Medicine

## 2016-06-14 ENCOUNTER — Telehealth: Payer: Self-pay

## 2016-06-14 DIAGNOSIS — M79641 Pain in right hand: Principal | ICD-10-CM

## 2016-06-14 DIAGNOSIS — G8929 Other chronic pain: Secondary | ICD-10-CM

## 2016-06-14 NOTE — Telephone Encounter (Signed)
Patient has been advised. KW 

## 2016-06-14 NOTE — Telephone Encounter (Signed)
-----   Message from Anola Gurneyobert Chauvin, [place] sent at 06/14/2016  7:47 AM EDT ----- No fracture or abnormality noted. Do you wish to see an orthopedic doctor?

## 2016-06-14 NOTE — Telephone Encounter (Signed)
Copy of office visit and x-ray report up front for pickup

## 2016-06-14 NOTE — Telephone Encounter (Signed)
Advised patient as below. Patient reports that he would be willing to see Ortho. Please call when this has been scheduled. Patient also reports that he needs a letter for his insurance that he was seen in the office yesterday, and all that was done (x-ray, treatment, referral, ect). Please call patient when the letter is ready for pick up. Thanks!

## 2016-06-14 NOTE — Telephone Encounter (Signed)
[id number] 

## 2016-07-09 ENCOUNTER — Encounter: Payer: Self-pay | Admitting: General Surgery

## 2016-07-09 ENCOUNTER — Ambulatory Visit (INDEPENDENT_AMBULATORY_CARE_PROVIDER_SITE_OTHER): Payer: Managed Care, Other (non HMO) | Admitting: General Surgery

## 2016-07-09 VITALS — BP 118/72 | HR 82 | Resp 12 | Ht 72.0 in | Wt 222.0 lb

## 2016-07-09 DIAGNOSIS — K4090 Unilateral inguinal hernia, without obstruction or gangrene, not specified as recurrent: Secondary | ICD-10-CM

## 2016-07-09 NOTE — Progress Notes (Signed)
Patient ID: Aaron PrettyStephen C Salm Jr., male   DOB: 1991-05-15, 25 y.o.   MRN: [id number]  Chief Complaint  Patient presents with  . Routine Post Op    HPI Aaron PrettyStephen C Bourbeau Jr. is a 25 y.o. male.  Here today for postoperative visit, he states he is doing well. Denies any gastrointestinal issues, bowels are moving regular. I have reviewed the history of present illness with the patient.  HPI  Past Medical History:  Diagnosis Date  . Family history of adverse reaction to anesthesia    DAD-N/V  . GERD (gastroesophageal reflux disease)    RARE-TUMS PRN    Past Surgical History:  Procedure Laterality Date  . INGUINAL HERNIA REPAIR Left 05/30/2016    Bard 3-D mesh /LAPAROSCOPIC INGUINAL HERNIA;   Kieth BrightlySeeplaputhur Hurst Kaiesha Tonner, MD;  [facility];   General;  Laterality: Left;  . WISDOM TOOTH EXTRACTION  2009    Family History  Problem Relation Age of Onset  . Colon cancer Father   . Sleep apnea Father   . Heart attack Paternal Grandfather     Social History Social History  Substance Use Topics  . Smoking status: Former Smoker    Types: Cigarettes  . Smokeless tobacco: Never Used     Comment: SOCIALLY  . Alcohol use Yes     Comment: 1-2  BEER WEEKLY    No Known Allergies  Current Outpatient Prescriptions  Medication Sig Dispense Refill  . acetaminophen (TYLENOL) 325 MG tablet Take 650 mg by mouth every 6 (six) hours as needed.    Marland Kitchen. ibuprofen (ADVIL,MOTRIN) 100 MG tablet Take 100 mg by mouth every 6 (six) hours as needed for fever.    . Melatonin 5 MG CAPS Take 1 capsule by mouth as needed.    . Multiple Vitamins-Minerals (MULTIVITAMIN WITH MINERALS) tablet Take 1 tablet by mouth daily.    . Omega-3 Fatty Acids (FISH OIL) 1000 MG CAPS Take by mouth.    Marland Kitchen. OVER THE COUNTER MEDICATION Take 1 Dose by mouth daily. PRE-WORKOUT POWDER DAILY     No current facility-administered medications for this visit.     Review of Systems Review of Systems  Constitutional: Negative.   Respiratory:  Negative.   Cardiovascular: Negative.     Blood pressure 118/72, pulse 82, resp. rate 12, height 6' (1.829 m), weight 222 lb (100.7 kg).  Physical Exam Physical Exam  Constitutional: He is oriented to person, place, and time. He appears well-developed and well-nourished.  Abdominal: Soft. Normal appearance and bowel sounds are normal. There is no tenderness.  Hernia repair intact   Neurological: He is alert and oriented to person, place, and time.  Skin: Skin is warm and dry.  Psychiatric: His behavior is normal.  Port sites are well healed  Data Reviewed Operative note  Assessment   stable postoperative exam.  Plan    Follow up as needed. Proper lifting techniques reviewed. Resume activities as tolerated.      This information has been scribed by Dorathy DaftMarsha Hatch RN, BSN,BC.   Aaron Hurst 07/11/2016, 8:21 AM

## 2016-07-09 NOTE — Patient Instructions (Signed)
The patient is aware to call back for any questions or concerns.  

## 2016-07-11 ENCOUNTER — Encounter: Payer: Self-pay | Admitting: General Surgery

## 2016-08-09 ENCOUNTER — Encounter: Payer: Self-pay | Admitting: General Surgery

## 2016-08-20 ENCOUNTER — Encounter: Payer: Self-pay | Admitting: General Surgery

## 2016-08-20 ENCOUNTER — Ambulatory Visit (INDEPENDENT_AMBULATORY_CARE_PROVIDER_SITE_OTHER): Payer: Managed Care, Other (non HMO) | Admitting: General Surgery

## 2016-08-20 VITALS — BP 120/80 | HR 82 | Resp 12 | Ht 72.0 in | Wt 224.0 lb

## 2016-08-20 DIAGNOSIS — K4090 Unilateral inguinal hernia, without obstruction or gangrene, not specified as recurrent: Secondary | ICD-10-CM

## 2016-08-20 NOTE — Patient Instructions (Addendum)
Apply ice to area if activity causes any pain or tenderness. Can take Aleve with food for a few weeks to lessen pain. The patient is aware to call back for any questions or concerns. Return as needed.

## 2016-08-20 NOTE — Progress Notes (Addendum)
Patient ID: Aaron PrettyStephen C Peppel Jr., male   DOB: 10-14-1990, 25 y.o.   MRN: [id number]  Chief Complaint  Patient presents with  . Follow-up    HPI Aaron PrettyStephen C Sample Jr. is a 25 y.o. male.  Here today for follow up and wants the area checked. He states he has noticed some tenderness and no bulge to the left groin. Just wanted it checked. I have reviewed the history of present illness with the patient.   HPI  Past Medical History:  Diagnosis Date  . Family history of adverse reaction to anesthesia    DAD-N/V  . GERD (gastroesophageal reflux disease)    RARE-TUMS PRN    Past Surgical History:  Procedure Laterality Date  . INGUINAL HERNIA REPAIR Left 05/30/2016    Bard 3-D mesh /LAPAROSCOPIC INGUINAL HERNIA;   Kieth BrightlySeeplaputhur G Sankar, MD;  [facility];   General;  Laterality: Left;  . WISDOM TOOTH EXTRACTION  2009    Family History  Problem Relation Age of Onset  . Colon cancer Father   . Sleep apnea Father   . Heart attack Paternal Grandfather     Social History Social History  Substance Use Topics  . Smoking status: Former Smoker    Types: Cigarettes  . Smokeless tobacco: Never Used     Comment: SOCIALLY  . Alcohol use Yes     Comment: 1-2  BEER WEEKLY    No Known Allergies  Current Outpatient Prescriptions  Medication Sig Dispense Refill  . acetaminophen (TYLENOL) 325 MG tablet Take 650 mg by mouth every 6 (six) hours as needed.    Marland Kitchen. ibuprofen (ADVIL,MOTRIN) 100 MG tablet Take 100 mg by mouth every 6 (six) hours as needed for fever.    . Melatonin 5 MG CAPS Take 1 capsule by mouth as needed.    . Multiple Vitamins-Minerals (MULTIVITAMIN WITH MINERALS) tablet Take 1 tablet by mouth daily.    . Omega-3 Fatty Acids (FISH OIL) 1000 MG CAPS Take by mouth.    Marland Kitchen. OVER THE COUNTER MEDICATION Take 1 Dose by mouth daily. PRE-WORKOUT POWDER DAILY     No current facility-administered medications for this visit.     Review of Systems Review of Systems  Constitutional: Negative.    Respiratory: Negative.   Cardiovascular: Negative.     Blood pressure 120/80, pulse 82, resp. rate 12, height 6' (1.829 m), weight 224 lb (101.6 kg).  Physical Exam Physical Exam  Constitutional: He is oriented to person, place, and time. He appears well-developed and well-nourished.  Abdominal: Normal appearance. There is no tenderness. Hernia confirmed negative in the right inguinal area and confirmed negative in the left inguinal area.  Neurological: He is alert and oriented to person, place, and time.  Skin: Skin is warm and dry.  Psychiatric: His behavior is normal.  RIH repeir appears intact. No palpable abnormality  Data Reviewed Progress notes  Assessment    2 months post-op left inguinal hernia repair. Pt advised - no findings of concern.     Plan    Apply ice to area if activity causes any pain or tenderness.  Can take Aleve with food for a few weeks to lessen pain. Return as needed. Call office if any new or worsening symptoms occur      This information has been scribed by Dorathy DaftMarsha Hatch RN, BSN,BC.  SANKAR,SEEPLAPUTHUR G 08/26/2016, 11:13 AM

## 2016-08-26 ENCOUNTER — Encounter: Payer: Self-pay | Admitting: General Surgery

## 2016-10-27 IMAGING — CR DG HAND COMPLETE 3+V*R*
1 series · 3 of 3 positions shown · non-contrast
Comparison: No prior.

CLINICAL DATA: Injury.  Pain.

EXAM:
RIGHT HAND - COMPLETE 3+ VIEW

[Series 1: dg hand complete right · 0.14mm/px · 3 of 3 slices shown]
[im 1/3]
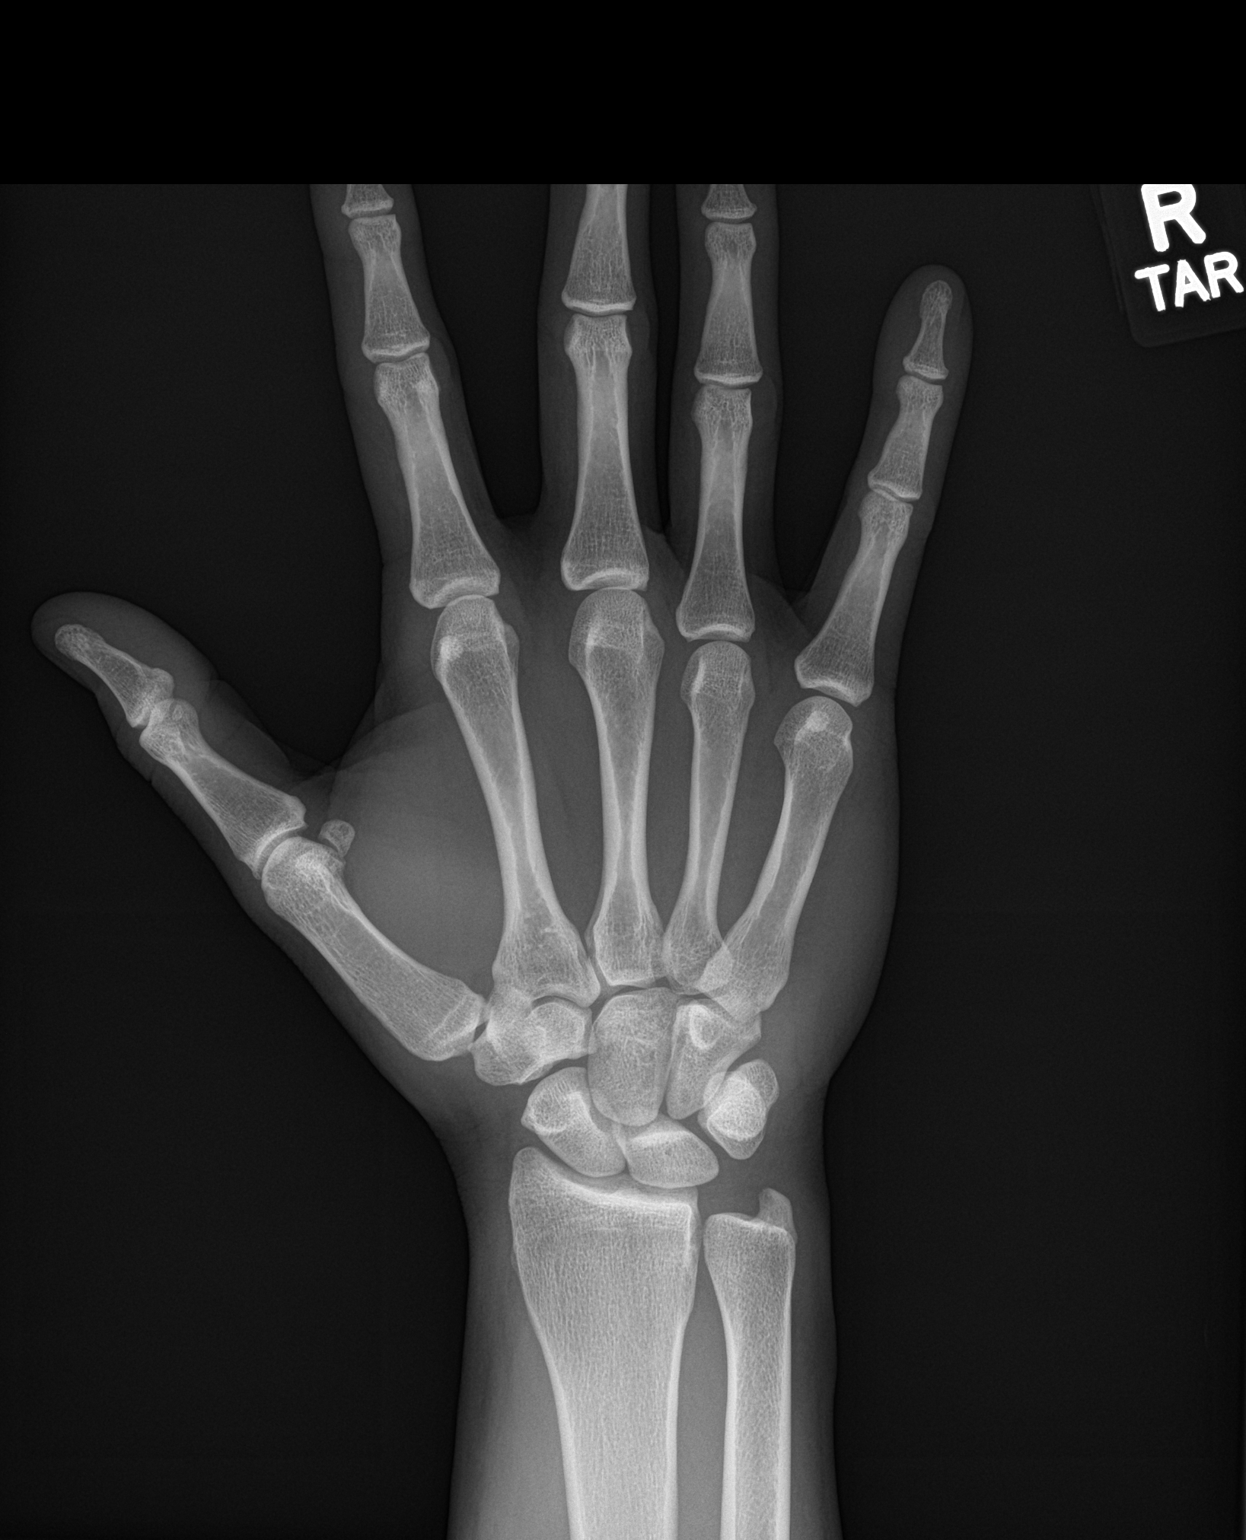
[im 2/3]
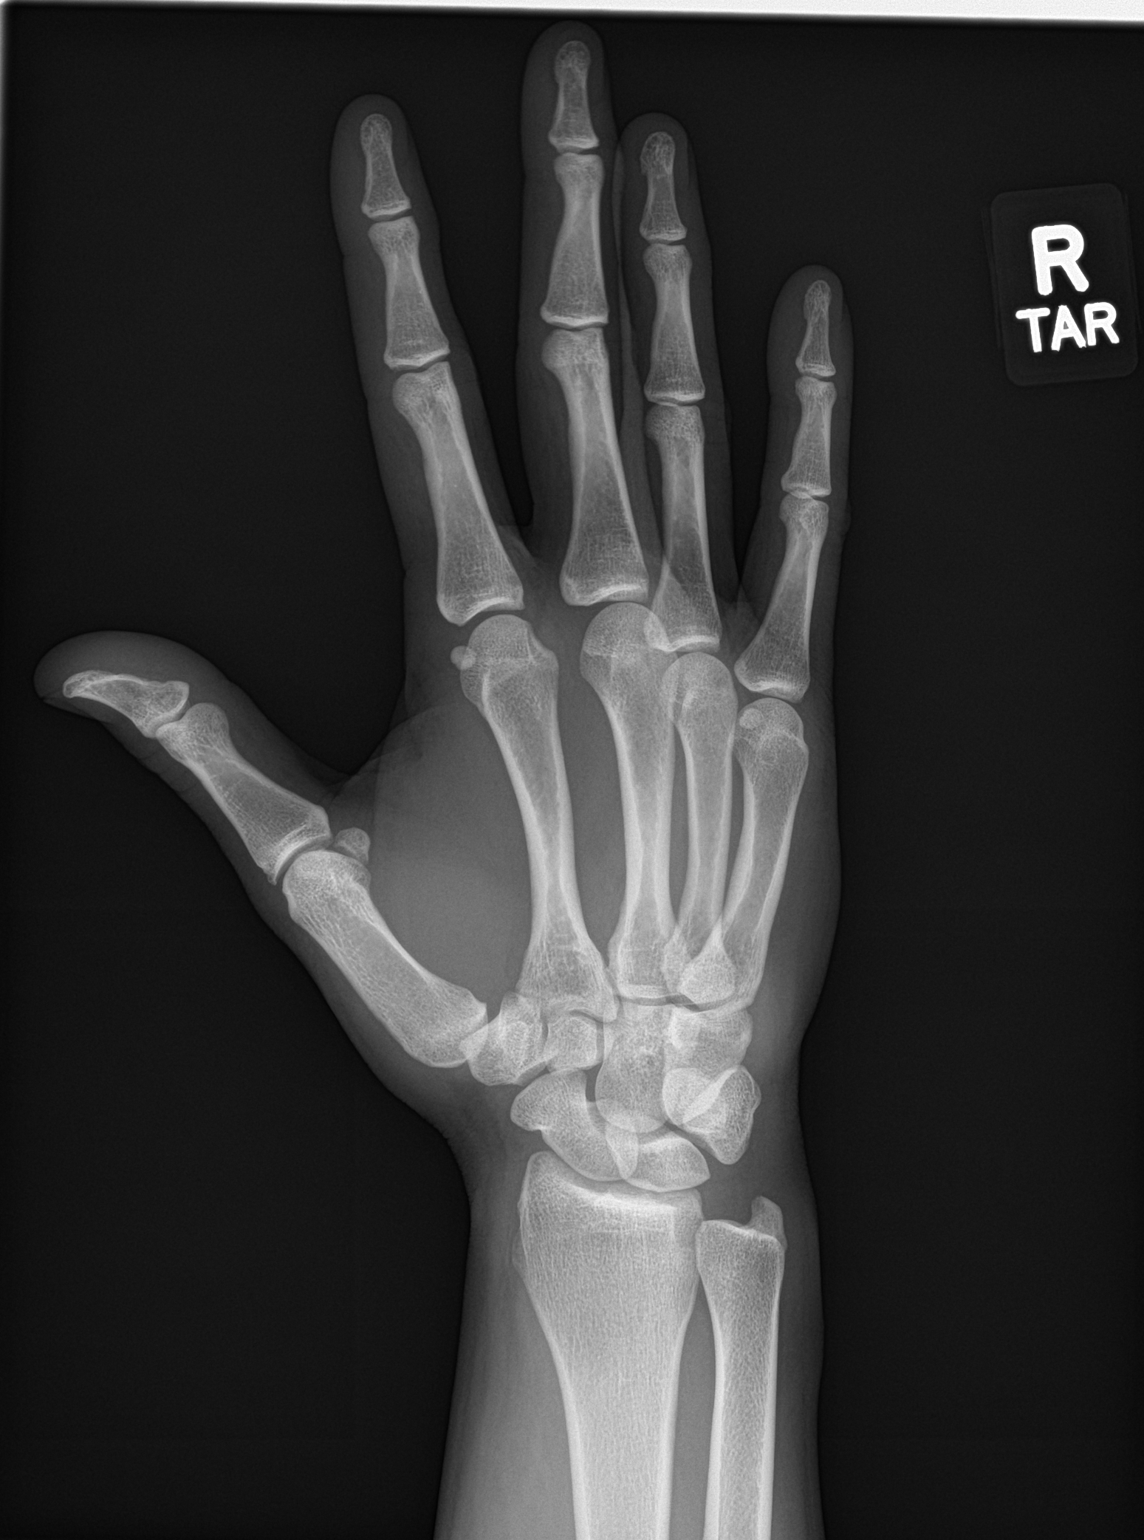
[im 3/3]
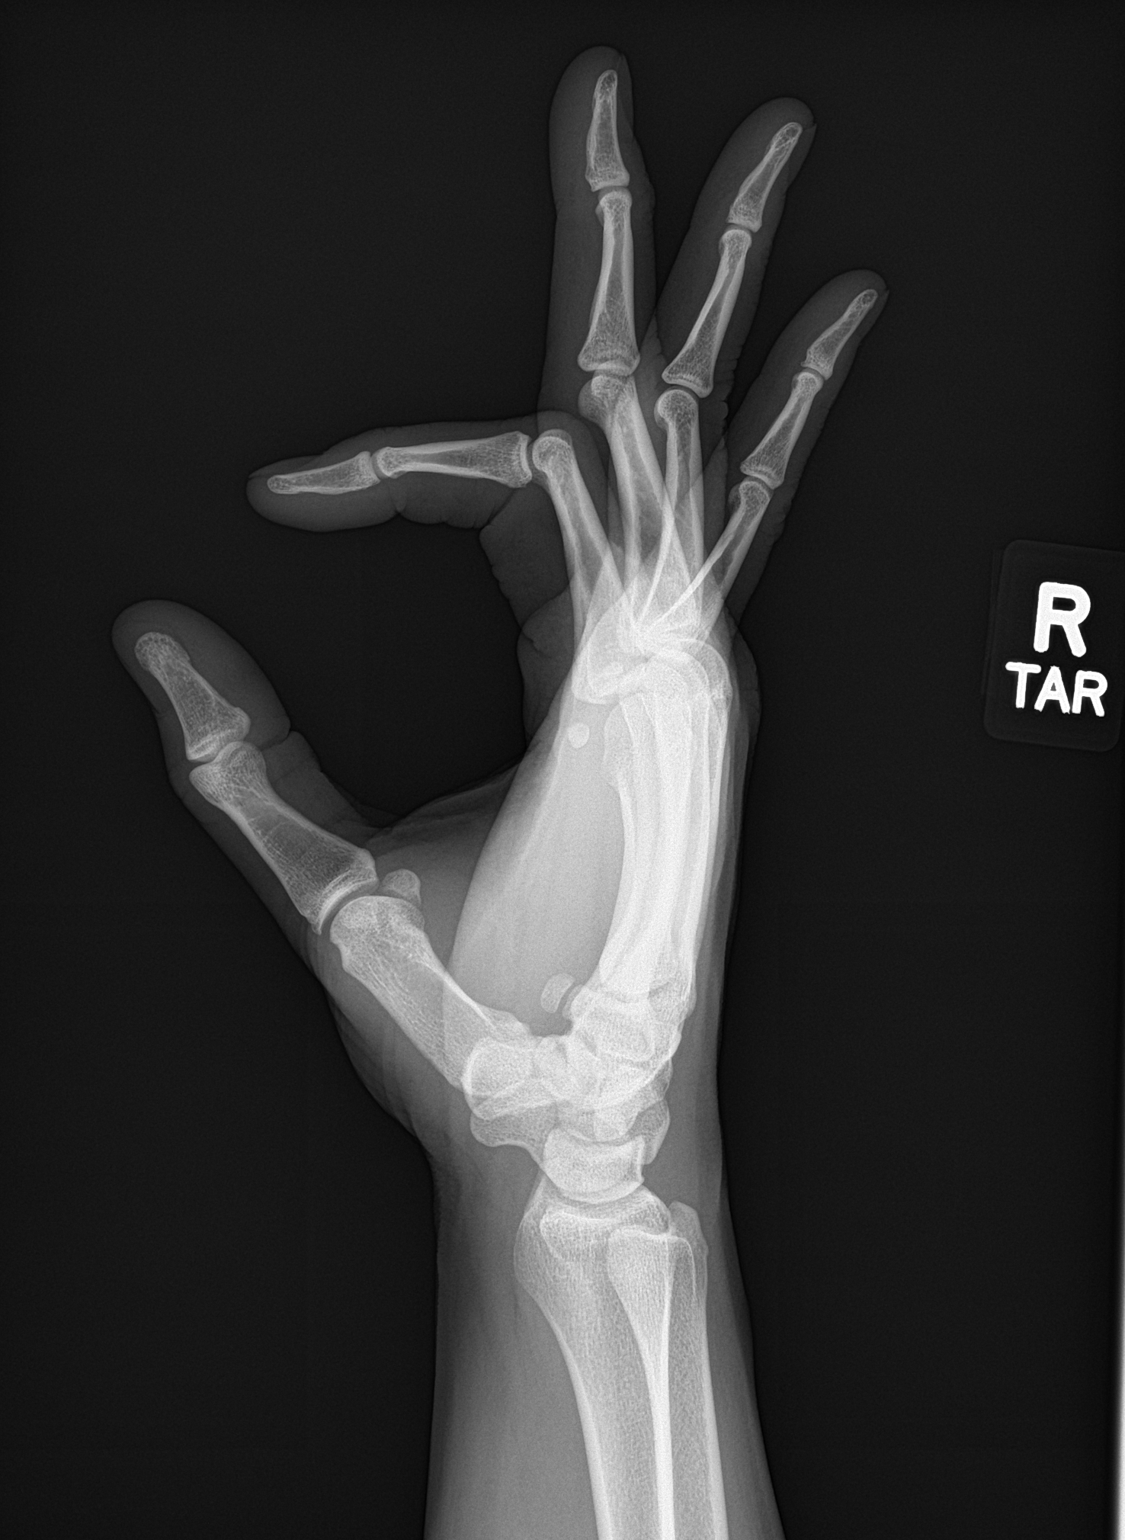

[3 of 3 positions shown; findings below may reference images not displayed]

FINDINGS: No acute bony or joint abnormality identified. No evidence of
fracture or dislocation.
IMPRESSION: No acute or focal abnormality.

## 2016-12-12 ENCOUNTER — Ambulatory Visit (INDEPENDENT_AMBULATORY_CARE_PROVIDER_SITE_OTHER): Payer: Commercial Managed Care - PPO | Admitting: Physician Assistant

## 2016-12-12 ENCOUNTER — Encounter: Payer: Self-pay | Admitting: Physician Assistant

## 2016-12-12 VITALS — BP 118/68 | HR 60 | Temp 98.6°F | Resp 16 | Wt 232.0 lb

## 2016-12-12 DIAGNOSIS — J0110 Acute frontal sinusitis, unspecified: Secondary | ICD-10-CM

## 2016-12-12 MED ORDER — AMOXICILLIN-POT CLAVULANATE 875-125 MG PO TABS: 1.0000 | ORAL_TABLET | Freq: Two times a day (BID) | ORAL | 0 refills | Status: AC

## 2016-12-12 NOTE — Patient Instructions (Signed)

## 2016-12-12 NOTE — Progress Notes (Signed)
Patient: Aaron Hurst. Male    DOB: 1990/10/20   26 y.o.   MRN: [id number] Visit Date: 12/13/2016  Today's Provider: Trey Sailors, PA-C   Chief Complaint  Patient presents with  . Sinusitis    Started earlier this week.    Subjective:    Sinusitis  This is a new problem. The current episode started in the past 7 days. The problem is unchanged. There has been no fever. Associated symptoms include congestion, coughing, headaches, sinus pressure, sneezing and a sore throat. Pertinent negatives include no chills, diaphoresis, ear pain, hoarse voice, neck pain, shortness of breath or swollen glands. Past treatments include oral decongestants. The treatment provided mild relief.   Patient is 26 y.o man with history of sinusitis presenting today with above symptoms. Has been treating with OTC cold/flu remedies with mild success.  No Known Allergies   Current Outpatient Prescriptions:  .  acetaminophen (TYLENOL) 325 MG tablet, Take 650 mg by mouth every 6 (six) hours as needed., Disp: , Rfl:  .  ibuprofen (ADVIL,MOTRIN) 100 MG tablet, Take 100 mg by mouth every 6 (six) hours as needed for fever., Disp: , Rfl:  .  Melatonin 5 MG CAPS, Take 1 capsule by mouth as needed., Disp: , Rfl:  .  Multiple Vitamins-Minerals (MULTIVITAMIN WITH MINERALS) tablet, Take 1 tablet by mouth daily., Disp: , Rfl:  .  Omega-3 Fatty Acids (FISH OIL) 1000 MG CAPS, Take by mouth., Disp: , Rfl:  .  OVER THE COUNTER MEDICATION, Take 1 Dose by mouth daily. PRE-WORKOUT POWDER DAILY, Disp: , Rfl:  .  amoxicillin-clavulanate (AUGMENTIN) 875-125 MG tablet, Take 1 tablet by mouth 2 (two) times daily., Disp: 20 tablet, Rfl: 0  Review of Systems  Constitutional: Negative.  Negative for chills and diaphoresis.  HENT: Positive for congestion, postnasal drip, rhinorrhea, sinus pain, sinus pressure, sneezing and sore throat. Negative for ear pain, hoarse voice, nosebleeds, tinnitus and trouble swallowing.   Eyes:  Negative.   Respiratory: Positive for cough. Negative for apnea, choking, chest tightness, shortness of breath, wheezing and stridor.   Gastrointestinal: Negative.   Musculoskeletal: Negative for neck pain.  Allergic/Immunologic: Positive for environmental allergies.  Neurological: Positive for headaches. Negative for dizziness and light-headedness.    Social History  Substance Use Topics  . Smoking status: Never Smoker  . Smokeless tobacco: Never Used     Comment: SOCIALLY  . Alcohol use Yes     Comment: 1-2  BEER WEEKLY   Objective:   BP 118/68 (BP Location: Left Arm, Patient Position: Sitting, Cuff Size: Large)   Pulse 60   Temp 98.6 F (37 C) (Oral)   Resp 16   Wt 232 lb (105.2 kg)   BMI 31.46 kg/m  Vitals:   12/12/16 1633  BP: 118/68  Pulse: 60  Resp: 16  Temp: 98.6 F (37 C)  TempSrc: Oral  Weight: 232 lb (105.2 kg)     Physical Exam  Constitutional: He is oriented to person, place, and time. He appears well-developed and well-nourished. No distress.  HENT:  Right Ear: External ear normal.  Left Ear: External ear normal.  Nose: Right sinus exhibits maxillary sinus tenderness and frontal sinus tenderness. Left sinus exhibits maxillary sinus tenderness and frontal sinus tenderness.  Mouth/Throat: Oropharynx is clear and moist and mucous membranes are normal. No oropharyngeal exudate, posterior oropharyngeal edema, posterior oropharyngeal erythema or tonsillar abscesses.  Eyes: Conjunctivae are normal.  Neck: Normal range of motion.  Cardiovascular: Normal rate and regular rhythm.   Pulmonary/Chest: Effort normal and breath sounds normal.  Lymphadenopathy:    He has cervical adenopathy.  Neurological: He is alert and oriented to person, place, and time.  Skin: Skin is warm and dry. He is not diaphoretic.  Psychiatric: He has a normal mood and affect. His behavior is normal.        Assessment & Plan:     1. Acute non-recurrent frontal sinusitis  Have  printed out a hard RX for patient to take if he significantly worsens, feels better and then starts to feel worse again, etc. Instructed him that without these indications, needs to have 10 days of symptoms before abx are appropriate.  - amoxicillin-clavulanate (AUGMENTIN) 875-125 MG tablet; Take 1 tablet by mouth 2 (two) times daily.  Dispense: 20 tablet; Refill: 0  Return if symptoms worsen or fail to improve.  The entirety of the information documented in the History of Present Illness, Review of Systems and Physical Exam were personally obtained by me. Portions of this information were initially documented by Kavin LeechLaura Tashiya Souders, CMA and reviewed by me for thoroughness and accuracy.         Trey SailorsAdriana M Pollak, PA-C  [facility]

## 2017-04-03 ENCOUNTER — Encounter: Payer: Self-pay | Admitting: General Surgery

## 2017-04-03 ENCOUNTER — Ambulatory Visit (INDEPENDENT_AMBULATORY_CARE_PROVIDER_SITE_OTHER): Payer: Commercial Managed Care - PPO | Admitting: General Surgery

## 2017-04-03 VITALS — BP 128/78 | Resp 12 | Ht 72.0 in | Wt 214.0 lb

## 2017-04-03 DIAGNOSIS — K4090 Unilateral inguinal hernia, without obstruction or gangrene, not specified as recurrent: Secondary | ICD-10-CM | POA: Diagnosis not present

## 2017-04-03 NOTE — Progress Notes (Signed)
Patient ID: Aaron Hurst., male   DOB: Dec 15, 1990, 26 y.o.   MRN: [id number]  Chief Complaint  Patient presents with  . Follow-up    HPI Aaron Hurst. is a 26 y.o. male here today for a re evaluation of left inguinal hernia site. Patient had surgery on 05/30/2016. He states he noticed a dull pain in that area about three weeks ago.  No bugle noticed  HPI  Past Medical History:  Diagnosis Date  . Family history of adverse reaction to anesthesia    DAD-N/V  . GERD (gastroesophageal reflux disease)    RARE-TUMS PRN    Past Surgical History:  Procedure Laterality Date  . INGUINAL HERNIA REPAIR Left 05/30/2016    Bard 3-D mesh /LAPAROSCOPIC INGUINAL HERNIA;   Kieth Brightly, MD;  [facility];   General;  Laterality: Left;  . WISDOM TOOTH EXTRACTION  2009    Family History  Problem Relation Age of Onset  . Colon cancer Father   . Sleep apnea Father   . Heart attack Paternal Grandfather     Social History Social History  Substance Use Topics  . Smoking status: Never Smoker  . Smokeless tobacco: Never Used     Comment: SOCIALLY  . Alcohol use Yes     Comment: 1-2  BEER WEEKLY    No Known Allergies  Current Outpatient Prescriptions  Medication Sig Dispense Refill  . acetaminophen (TYLENOL) 325 MG tablet Take 650 mg by mouth every 6 (six) hours as needed.    Marland Kitchen ibuprofen (ADVIL,MOTRIN) 100 MG tablet Take 100 mg by mouth every 6 (six) hours as needed for fever.    . Melatonin 5 MG CAPS Take 1 capsule by mouth as needed.    . Multiple Vitamins-Minerals (MULTIVITAMIN WITH MINERALS) tablet Take 1 tablet by mouth daily.    . Omega-3 Fatty Acids (FISH OIL) 1000 MG CAPS Take by mouth.    . sulfamethoxazole-trimethoprim (BACTRIM,SEPTRA) 400-80 MG tablet Take 1 tablet by mouth 2 (two) times daily.    Marland Kitchen OVER THE COUNTER MEDICATION Take 1 Dose by mouth daily. PRE-WORKOUT POWDER DAILY     No current facility-administered medications for this visit.     Review of  Systems Review of Systems  Constitutional: Negative.   Respiratory: Negative.   Cardiovascular: Negative.     Blood pressure 128/78, resp. rate 12, height 6' (1.829 m), weight 214 lb (97.1 kg).  Physical Exam Physical Exam  Constitutional: He is oriented to person, place, and time. He appears well-developed and well-nourished.  Abdominal: Soft. Normal appearance. No hernia.  Neurological: He is alert and oriented to person, place, and time.  Skin: Skin is warm and dry.  Left inguinal area shows hernia or any palpable abnormality  Data Reviewed Prior notes reviewed   Assessment    Stable exam,Left inguinal hernia intact and well healed. Pt reassured. Current pain may have been caused by his workouts.    Plan    Patient to return as needed.The patient is aware to use an antiinflammatory of choice (Advil or Aleve) as needed for comfort.The patient is aware to call back for any questions or concerns.  HPI, Physical Exam, Assessment and Plan have been scribed under the direction and in the presence of Kathreen Cosier, MD  Ples Specter, CMA     I have completed the exam and reviewed the above documentation for accuracy and completeness.  I agree with the above.  Museum/gallery conservator has been used and any  errors in dictation or transcription are unintentional.  Kimball Manske G. Evette CristalSankar, M.D., F.A.C.S.    Aaron Hurst,Aaron Hurst G 04/07/2017, 4:36 PM

## 2017-04-03 NOTE — Patient Instructions (Addendum)
PPatient to return as needed.The patient is aware to use an antiinflammatory of choice (Advil or Aleve) as needed for comfort.The patient is aware to call back for any questions or concerns.

## 2018-08-04 ENCOUNTER — Encounter: Payer: Self-pay | Admitting: Family Medicine

## 2018-08-04 ENCOUNTER — Ambulatory Visit (INDEPENDENT_AMBULATORY_CARE_PROVIDER_SITE_OTHER): Payer: Commercial Managed Care - PPO | Admitting: Family Medicine

## 2018-08-04 VITALS — BP 120/70 | HR 76 | Temp 98.8°F | Resp 16 | Wt 220.0 lb

## 2018-08-04 DIAGNOSIS — Z23 Encounter for immunization: Secondary | ICD-10-CM | POA: Diagnosis not present

## 2018-08-04 DIAGNOSIS — Z0000 Encounter for general adult medical examination without abnormal findings: Secondary | ICD-10-CM

## 2018-08-04 NOTE — Patient Instructions (Signed)
We will call you with the lab results. 

## 2018-08-04 NOTE — Progress Notes (Signed)
  Subjective:     Patient ID: Aaron PrettyStephen C Scales Jr., male   DOB: July 20, 1991, 27 y.o.   MRN: [id number] Chief Complaint  Patient presents with  . Annual Exam    Patient presents today for a complete physical. Patient states he is feeling well and sleep well. Patient does exercise daily and well balance diet.   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. Expecting his first child in January. Work out at [employer] 3 x week.  Review of Systems General: Feeling well HEENT: regular dental visits. Does not require glasses or contacts. Cardiovascular: no chest pain, shortness of breath, or palpitations GI: no heartburn, no change in bowel habits GU: no change in bladder habits  Psychiatric: not depressed Musculoskeletal: no joint pain    Objective:   Physical Exam  Constitutional: He appears well-developed and well-nourished. No distress.  Eyes: PERRLA Neck: no thyromegaly, tenderness or nodules, no cervical adenopathy ENT: TM's intact without inflammation; No tonsillar enlargement or exudate, Lungs: Clear Heart : RRR without murmur or gallop Abd: bowel sounds present, soft, non-tender, no organomegaly Extremities: no edema, pedal pulses intact Skin: no atypical lesions noted on his back     Assessment:    1. Need for influenza vaccination - Flu Vaccine QUAD 36+ mos IM  2. Annual physical exam - Comprehensive metabolic panel - Lipid panel    Plan:    Further f/u pending lab work.

## 2019-07-28 ENCOUNTER — Ambulatory Visit (INDEPENDENT_AMBULATORY_CARE_PROVIDER_SITE_OTHER): Payer: Commercial Managed Care - PPO

## 2019-07-28 DIAGNOSIS — Z23 Encounter for immunization: Secondary | ICD-10-CM | POA: Diagnosis not present

## 2020-07-14 ENCOUNTER — Encounter: Payer: Self-pay | Admitting: Adult Health

## 2020-07-14 ENCOUNTER — Telehealth (INDEPENDENT_AMBULATORY_CARE_PROVIDER_SITE_OTHER): Payer: Commercial Managed Care - PPO | Admitting: Adult Health

## 2020-07-14 DIAGNOSIS — B084 Enteroviral vesicular stomatitis with exanthem: Secondary | ICD-10-CM | POA: Diagnosis not present

## 2020-07-14 DIAGNOSIS — B09 Unspecified viral infection characterized by skin and mucous membrane lesions: Secondary | ICD-10-CM | POA: Diagnosis not present

## 2020-07-14 MED ORDER — IBUPROFEN 600 MG PO TABS: 600.0000 mg | ORAL_TABLET | Freq: Three times a day (TID) | ORAL | 0 refills | Status: AC | PRN

## 2020-07-14 MED ORDER — LIDOCAINE VISCOUS HCL 2 % MT SOLN: OROMUCOSAL | 0 refills | Status: AC

## 2020-07-14 NOTE — Patient Instructions (Addendum)
Ok to swish in mouth with liquid benadryl mixed with liquid antacid to help coat oral sores. Mix half and half and swish and spit every 4- 6 hours is ok.   Increase fluids, rest. Cold fluids and foods usually help. Warm fluids help some.   Ibuprofen 600mg  every 8 hours PRN pain may treat breakthrough pain with Tylenol 1000 mg every 8 hours as needed as well.   Seek care immediately if any symptoms worsen.    Meds ordered this encounter  Medications  . ibuprofen (ADVIL) 600 MG tablet    Sig: Take 1 tablet (600 mg total) by mouth every 8 (eight) hours as needed for moderate pain.    Dispense:  30 tablet    Refill:  0  . lidocaine (XYLOCAINE) 2 % solution    Sig: Apply small amount  to affected areas in mouth with cotton tip applicator every 8 hours as needed, do not swallow    Dispense:  15 mL    Refill:  0     Hand, Foot, and Mouth Disease, Adult Hand, foot, and mouth disease is a common viral illness. It happens mainly in children who are younger than 29 years old, but adolescents and adults may also get it. The illness often causes:  Sore throat.  Sores in the mouth.  Fever.  Rash on the hands and feet. Usually, this condition is not serious. Most people get better within 1-2 weeks. What are the causes? This condition is usually caused by a group of viruses called enteroviruses. The disease can spread from person to person (is contagious). A person is most contagious during the first week of the illness. The infection spreads through direct contact with:  Nose discharge of an infected person.  Throat discharge of an infected person.  Stool (feces) of an infected person. What are the signs or symptoms? Symptoms of this condition include:  Small sores in the mouth. These may cause pain.  A rash on the hands and feet, and sometimes on the buttocks. The rash may also occur on the arms, legs, or other areas of the body. The rash may look like small red bumps or sores and may  have blisters.  Fever.  Body aches or headaches.  Irritability.  Decreased appetite. How is this diagnosed? This condition can usually be diagnosed with a physical exam in which your health care provider will look at your rash and mouth sores. Tests are usually not needed. In some cases, a stool (feces) sample or a throat swab may be taken to check for the virus or for other infections. How is this treated? In most cases, no treatment is needed. People usually get better within 2 weeks without treatment. To help relieve pain or fever, your health care provider may recommend over-the-counter medicines such as ibuprofen or acetaminophen. To help relieve discomfort from mouth sores, your health care provider may recommend using:  Solutions that are rinsed in the mouth.  Pain-relieving gel that is applied to the sores (topical gel).  Antacid medicine. Follow these instructions at home: Managing pain and discomfort  Rinse your mouth with a salt-water mixture 3-4 times a day or as needed. To make a salt-water mixture, completely dissolve -1 tsp of salt in 1 cup of warm water. This can help to reduce pain from the mouth sores. Your health care provider may also recommend other rinse solutions to treat mouth sores.  To relieve discomfort when you are eating: ? Try combinations of foods to  see what you can tolerate. Aim for a balanced diet. ? Eat soft foods. These may be easier to swallow. ? Avoid foods and drinks that are salty, spicy, or acidic. ? Avoid alcohol. ? Try cold food and drinks, such as water, milk, milkshakes, frozen ice pops, slushies, and sherbets. Low-calorie sport drinks are good choices for staying hydrated. General instructions   Return to your normal activities as told by your health care provider. Ask your health care provider what activities are safe for you.  Take or apply over-the-counter and prescription medicines only as told by your health care provider.  Wash  your hands often with soap and water. If soap and water are not available, use hand sanitizer.  Stay away from work, schools, or other group settings during the first few days of the illness, or until your fever is gone.  Keep all follow-up visits as told by your health care provider. This is important. Contact a health care provider if:  Your symptoms get worse or do not improve within 2 weeks.  You have pain that does not get better with medicine.  You feel very irritable.  You have trouble swallowing.  You develop sores or blisters on your lips or outside of your mouth.  You have a fever for more than 3 days. Get help right away if:  You develop signs of severe dehydration, such as: ? Decreased urination. This means urinating only very small amounts or urinating fewer than 3 times in a 24-hour period. ? Urine that is very dark. ? Dry mouth, tongue, or lips. ? Decreased tears or sunken eyes. ? Dry skin. ? Rapid breathing. ? Decreased activity or being very sleepy. ? Pale skin. ? Fingertips taking longer than 2 seconds to turn pink after a gentle squeeze. ? Weight loss.  You have a severe headache.  You have a stiff neck.  You experience changes in your behavior.  You have chest pain.  You have trouble breathing. Summary  Hand, foot, and mouth disease is a common viral illness.  This disease can spread from person to person (is contagious).  The illness often causes a sore throat, sores in the mouth, fever, and a rash on the hands and feet.  Typically, no treatment is needed for this condition. People usually get better within 2 weeks without treatment.  Get help right away if you develop signs of severe dehydration. This information is not intended to replace advice given to you by your health care provider. Make sure you discuss any questions you have with your health care provider. Document Revised: 12/31/2018 Document Reviewed: 10/28/2016 Elsevier Patient  Education  2020 [employer]. Patient education: Hand, foot, and mouth disease and herpangina (The Basics) View in    Written by the doctors and editors at UpToDate What is hand, foot, and mouth disease?--Hand, foot, and mouth disease is an infection that causes sores in the mouth and on the hands, feet, buttocks, and sometimes genitals. A related infection, called "herpangina," causes sores just in the mouth and throat. Both infections most often affect children, but adults can get them, too. This article is mostly about hand, foot, and mouth disease. But herpangina has similar symptoms and is treated in the same way. Hand, foot, and mouth disease usually goes away on its own within a week or so. But there are things you can do to help relieve symptoms. What are the symptoms of hand, foot, and mouth disease?--The main symptom is sores in the mouth,  and on the hands, feet, buttocks, and sometimes genitals. They can look like small spots, bumps, or blisters (picture 1 and picture 2). The sores in the mouth can make swallowing painful. The sores on the hands and feet might be painful. It is possible to get the sores only in some areas. Not every person gets them on their hands, feet, and mouth. Herpangina can also cause sores in the throat. Hand, foot, and mouth disease sometimes causes a fever. People with herpangina usually get a high fever that comes on suddenly. How does hand, foot, and mouth disease spread?--The virus that causes hand, foot, and mouth disease can travel in body fluids of an infected person. For example, the virus can be found in: ?Mucus from the nose ?Saliva ?Fluid from one of the sores ?Traces of bowel movements People with hand, foot, and mouth disease are most likely to spread the infection during the first week of their illness. But the virus can live in their body for weeks or even months after the symptoms have gone away. Is there a test for hand, foot, and mouth  disease?--Yes, but it is not usually necessary. The doctor or nurse should be able to tell if a child has it by learning about their symptoms and doing an exam. Should the child see a doctor or nurse?--You should call the doctor or nurse if the child is drinking less than usual and hasn't had a wet diaper for 4 to 6 hours (for babies and young children) or hasn't needed to urinate in the past 6 to 8 hours (for older children). You should also call if the child seems to be getting worse or isn't getting better after a few days. How is hand, foot, and mouth disease treated?--The infection itself is not treated. It usually goes away on its own within about a week. But children who are in pain can take nonprescription medicines such as acetaminophen (sample brand name: Tylenol) or ibuprofen (sample brand names: Advil, Motrin) to relieve pain. Never give aspirin to a child younger than 18 years. In children, aspirin can cause a serious problem called Reye syndrome. The sores in the mouth can make swallowing painful, so some children might not want to eat or drink. It is important to make sure that children get enough fluids so that they don't get dehydrated. Cold foods, like popsicles and ice cream, can help to numb the pain. Soft foods, like pudding and gelatin, might be easier to swallow. Treatment for herpangina is the same as for hand, foot, and mouth disease. Can hand, foot, and mouth disease be prevented?--Yes. The most important thing you can do to prevent the spread of this infection is to wash your hands often with soap and water, even after the child is feeling better. Teach children to wash their hands often, especially after using the bathroom (table 1).  It's also important to keep your home clean and to disinfect tabletops, toys, and other things that a child might touch. If a child has hand, foot, and mouth disease or herpangina, keep them out of school or day care if they have a fever or  don't feel well enough to go. You should also keep the child home if they are drooling a lot or have open sores. All topics are updated as new evidence becomes available and our peer review process is complete. This topic retrieved from UpToDate on: Jul 14, 2020. The content on the UpToDate website is not intended nor recommended as  a substitute for medical advice, diagnosis, or treatment. Always seek the advice of your own physician or other qualified health care professional regarding any medical questions or conditions. The use of UpToDate content is governed by the UpToDate Terms of Use. 2021 UpToDate, Inc. All rights reserved. Topic [id number] Version 12.0

## 2020-07-14 NOTE — Progress Notes (Signed)
MyChart Video Visit    Virtual Visit via Video Note   This visit type was conducted due to national recommendations for restrictions regarding the COVID-19 Pandemic (e.g. social distancing) in an effort to limit this patient's exposure and mitigate transmission in our community. This patient is at least at moderate risk for complications without adequate follow up. This format is felt to be most appropriate for this patient at this time. Physical exam was limited by quality of the video and audio technology used for the visit.   Patient location: at home  Provider location: Provider: Provider's office at  [facility], [place].     I discussed the limitations of evaluation and management by telemedicine and the availability of in person appointments. The patient expressed understanding and agreed to proceed.  Patient: Aaron Hurst.   DOB: 06/24/1991   29 y.o. Male  MRN: [id number] Visit Date: 07/14/2020  Today's healthcare provider: Jairo Ben, FNP   Chief Complaint  Patient presents with  . Rash   Subjective    Rash This is a new problem. The affected locations include the face, left hand, right hand, right foot, left foot and torso. The rash is characterized by redness. Associated with: child who had Hand Foot and Mouth. Associated symptoms include facial edema, a fever and a sore throat. Pertinent negatives include no anorexia, congestion, cough, diarrhea, eye pain, fatigue, joint pain, nail changes, rhinorrhea, shortness of breath or vomiting. Past treatments include nothing.    Son has coxsackie virus over one week now, diagnosed by pediatrician.   Patient now has blisters, bumps on his hands, feet, sore throat, Started on Wednesday with 101 fever, fever resolved that day. Rash appeared on Thursday and had sore throat. Denies any difficulty swallowing or tongue swelling .  Denies new medication or any new exposure.  Patient  denies any  , body aches,chills, chest pain, shortness of breath, nausea, vomiting, or diarrhea.    Patient Active Problem List   Diagnosis Date Noted  . Seasonal allergies 04/24/2016   Past Medical History:  Diagnosis Date  . Family history of adverse reaction to anesthesia    DAD-N/V  . GERD (gastroesophageal reflux disease)    RARE-TUMS PRN   No Known Allergies    Medications: Outpatient Medications Prior to Visit  Medication Sig  . acetaminophen (TYLENOL) 325 MG tablet Take 650 mg by mouth every 6 (six) hours as needed.  . Melatonin 5 MG CAPS Take 1 capsule by mouth as needed.  . Multiple Vitamins-Minerals (MULTIVITAMIN WITH MINERALS) tablet Take 1 tablet by mouth daily.  . Omega-3 Fatty Acids (FISH OIL) 1000 MG CAPS Take by mouth.  Marland Kitchen OVER THE COUNTER MEDICATION Take 1 Dose by mouth daily. PRE-WORKOUT POWDER DAILY  . [DISCONTINUED] ibuprofen (ADVIL,MOTRIN) 100 MG tablet Take 100 mg by mouth every 6 (six) hours as needed for fever.   No facility-administered medications prior to visit.    Review of Systems  Constitutional: Positive for fever. Negative for fatigue.  HENT: Positive for sore throat. Negative for congestion and rhinorrhea.   Eyes: Negative for pain.  Respiratory: Negative for cough and shortness of breath.   Gastrointestinal: Negative for anorexia, diarrhea and vomiting.  Musculoskeletal: Negative for joint pain.  Skin: Positive for rash. Negative for nail changes.      Objective    There were no vitals taken for this visit.  No vitals.  Physical Exam    Patient is alert and oriented  and responsive to questions Engages in conversation with provider. Speaks in full sentences without any pauses without any shortness of breath or distress.   Rash on hands and feet bilateral and small blisters on periorbital area visualized on video camera.   Assessment & Plan     Hand, foot and mouth disease (HFMD)  Viral exanthem   Meds ordered this encounter    Medications  . ibuprofen (ADVIL) 600 MG tablet    Sig: Take 1 tablet (600 mg total) by mouth every 8 (eight) hours as needed for moderate pain.    Dispense:  30 tablet    Refill:  0  . lidocaine (XYLOCAINE) 2 % solution    Sig: Apply small amount  to affected areas in mouth with cotton tip applicator every 8 hours as needed, do not swallow    Dispense:  15 mL    Refill:  0   Ok to swish in mouth with liquid benadryl mixed with liquid antacid to help coat oral sores. Mix half and half and swish and spit every 4- 6 hours is ok.   Increase fluids, rest. Cold fluids and foods usually help. Warm fluids help some.   Ibuprofen 600mg  every 8 hours PRN pain may treat breakthrough pain with Tylenol 1000 mg every 8 hours as needed as well.   Seek care immediately if any symptoms worsen.  No follow-ups on file.     I discussed the assessment and treatment plan with the patient. The patient was provided an opportunity to ask questions and all were answered. The patient agreed with the plan and demonstrated an understanding of the instructions.   The patient was advised to call back or seek an in-person evaluation if the symptoms worsen or if the condition fails to improve as anticipated.  I provided 25  minutes of non-face-to-face time during this encounter.  I discussed the limitations of evaluation and management by telemedicine and the availability of in person appointments. The patient expressed understanding and agreed to proceed.   , FNP [facility] [phone] (phone) [phone] (fax)  [facility]

## 2021-10-26 ENCOUNTER — Ambulatory Visit: Payer: Commercial Managed Care - PPO | Admitting: Physician Assistant

## 2021-10-26 ENCOUNTER — Other Ambulatory Visit: Payer: Self-pay | Admitting: Physician Assistant

## 2021-10-26 ENCOUNTER — Encounter: Payer: Self-pay | Admitting: Physician Assistant

## 2021-10-26 ENCOUNTER — Encounter: Payer: Self-pay | Admitting: *Deleted

## 2021-10-26 ENCOUNTER — Other Ambulatory Visit: Payer: Self-pay

## 2021-10-26 VITALS — BP 144/89 | HR 92 | Temp 98.2°F | Resp 16 | Wt 237.0 lb

## 2021-10-26 DIAGNOSIS — J039 Acute tonsillitis, unspecified: Secondary | ICD-10-CM

## 2021-10-26 DIAGNOSIS — J029 Acute pharyngitis, unspecified: Secondary | ICD-10-CM | POA: Diagnosis not present

## 2021-10-26 LAB — POCT RAPID STREP A (OFFICE): Rapid Strep A Screen: NEGATIVE

## 2021-10-26 MED ORDER — PREDNISONE 20 MG PO TABS: ORAL_TABLET | ORAL | 0 refills | Status: DC

## 2021-10-26 MED ORDER — PREDNISONE 20 MG PO TABS: ORAL_TABLET | ORAL | 0 refills | Status: AC

## 2021-10-26 NOTE — Progress Notes (Signed)
Established patient visit   I,April Miller,acting as a scribe for [employer], PA-C.,have documented all relevant documentation on the behalf of [place], PA-C,as directed by  Junie Panning E Ahsley Attwood, PA-C while in the presence of Adrine Hayworth E Gavriela Cashin, PA-C.   Patient: Aaron Hurst.   DOB: December 03, 1990   31 y.o. Male  MRN: [id number] Visit Date: 10/26/2021  Today's healthcare provider: Dani Gobble Charisse Wendell, PA-C  Introduced myself to the patient as a Journalist, newspaper and provided education on APPs in clinical practice.    Chief Complaint  Patient presents with   Sore Throat   Subjective    Sore Throat  This is a new problem. The current episode started in the past 7 days (1 week). The problem has been gradually worsening. There has been no fever. The pain is moderate. Associated symptoms include congestion, a plugged ear sensation, neck pain, swollen glands and trouble swallowing (Discomfort with swallowing). Pertinent negatives include no abdominal pain, coughing, diarrhea, drooling, ear discharge, ear pain, headaches, hoarse voice, shortness of breath, stridor or vomiting. He has had no exposure to strep or mono. He has tried NSAIDs (OTC cold and flu medication) for the symptoms. The treatment provided mild relief.    Patient has had sore throat for 1 week. Patient also has symptoms of nasal and ear congestion. He has pus pockets in his throat. Patient saw [facility], where his was tested for strep and covid, both were negative. Patient states he taking OTC cold and Flu medication with mild relief.   Went to [place] on Tuesday - was tested for strep and COVID at that time. Both negative States for past week, sore throat, white discoloration on the back of the throat  States there is some nasal congestion but this is intermittent  Mild ear fullness relieved with "popping" ears Reports he has had some difficulty with sleeping due to discomfort  States he noticed the white discoloration on Saturday   Reports  he had a similar issue last year that resolved without the need for antibiotics and without complications    Medications: Outpatient Medications Prior to Visit  Medication Sig   acetaminophen (TYLENOL) 325 MG tablet Take 650 mg by mouth every 6 (six) hours as needed.   ibuprofen (ADVIL) 600 MG tablet Take 1 tablet (600 mg total) by mouth every 8 (eight) hours as needed for moderate pain.   lidocaine (XYLOCAINE) 2 % solution Apply small amount  to affected areas in mouth with cotton tip applicator every 8 hours as needed, do not swallow   Melatonin 5 MG CAPS Take 1 capsule by mouth as needed.   Multiple Vitamins-Minerals (MULTIVITAMIN WITH MINERALS) tablet Take 1 tablet by mouth daily.   Omega-3 Fatty Acids (FISH OIL) 1000 MG CAPS Take by mouth.   OVER THE COUNTER MEDICATION Take 1 Dose by mouth daily. PRE-WORKOUT POWDER DAILY   No facility-administered medications prior to visit.    Review of Systems  Constitutional:  Negative for appetite change, chills, fatigue and fever.  HENT:  Positive for congestion, sore throat and trouble swallowing (Discomfort with swallowing). Negative for drooling, ear discharge, ear pain and hoarse voice.   Respiratory:  Negative for cough, chest tightness, shortness of breath, wheezing and stridor.   Cardiovascular:  Negative for chest pain and palpitations.  Gastrointestinal:  Negative for abdominal pain, diarrhea, nausea and vomiting.  Musculoskeletal:  Positive for neck pain. Negative for neck stiffness.  Neurological:  Negative for headaches.  Objective    BP (!) 144/89 (BP Location: Right Arm, Patient Position: Sitting, Cuff Size: Large)    Pulse 92    Temp 98.2 F (36.8 C) (Temporal)    Resp 16    Wt 237 lb (107.5 kg)    SpO2 98%    BMI 32.14 kg/m  {Show previous vital signs (optional):23777}  Physical Exam Vitals reviewed.  Constitutional:      Appearance: He is well-developed.  HENT:     Head: Normocephalic and atraumatic.     Right  Ear: Hearing, tympanic membrane, ear canal and external ear normal.     Left Ear: Hearing, tympanic membrane, ear canal and external ear normal.     Nose: Nose normal.     Right Turbinates: Not swollen or pale.     Left Turbinates: Not swollen or pale.     Mouth/Throat:     Mouth: Mucous membranes are moist.     Pharynx: Oropharynx is clear. Uvula midline. Posterior oropharyngeal erythema present. No uvula swelling.     Tonsils: Tonsillar exudate present. 2+ on the right.     Comments: Notable swelling and exudates along right tonsillar pillar  Left tonsillar pillar is mildly coated with exudates but very mild swelling  Petechiae on soft palate Neck:     Trachea: Trachea and phonation normal.  Cardiovascular:     Rate and Rhythm: Normal rate and regular rhythm.     Pulses: Normal pulses.     Heart sounds: Normal heart sounds.  Pulmonary:     Effort: Pulmonary effort is normal.     Breath sounds: Normal breath sounds and air entry. No decreased breath sounds, wheezing, rhonchi or rales.  Musculoskeletal:     Cervical back: Normal range of motion and neck supple.     Right lower leg: No edema.     Left lower leg: No edema.  Lymphadenopathy:     Head:     Right side of head: Submandibular and tonsillar adenopathy present. No submental adenopathy.     Left side of head: Submandibular adenopathy present. No submental adenopathy.     Cervical: Cervical adenopathy present.     Right cervical: Superficial cervical adenopathy present.     Left cervical: No superficial cervical adenopathy.  Neurological:     Mental Status: He is alert.      No results found for any visits on 10/26/21.  Assessment & Plan     1. Sore throat Acute, new problem since Saturday, appears stable No associated fever, cough, congestion Suspicious for strep throat but twice negative rapid strep  Swab for strep culture sent for confirmation.  Will provide abx if positive for strep   - POCT rapid strep A -  Culture, Group A Strep  2. Tonsillitis with exudate Acute, new problem with associated sore throat No associated fever, cough, congestion Still suspicious for strep given exam results - swab sent for strep culture.  Will provide abx if indicated Provided steroid taper to assist with inflammation with hopes of preventing continued swelling over weekend Patient can continue to take Tylenol and Ibuprofen for pain as needed Was provided instructions regarding follow up and ED precautions  - predniSONE (DELTASONE) 20 MG tablet; Take 60mg  PO daily x 2 days, then40mg  PO daily x 2 days, then 20mg  PO daily x 3 days Take in the AM to prevent trouble sleeping  Dispense: 15 tablet; Refill: 0 - Culture, Group A Strep    The entirety of the information documented  in the History of Present Illness, Review of Systems and Physical Exam were personally obtained by me. Portions of this information were initially documented by the CMA and reviewed by me for thoroughness and accuracy.   Dani Gobble Lexine Jaspers, PA-C    Almon Register, PA-C  [employer] [phone] (phone) [phone] (fax)  [place]

## 2021-10-26 NOTE — Patient Instructions (Addendum)
Your strep test was negative here but I would like to send it off for culture to determine if there is a different kind of bacteria causing your symptoms This could be viral in nature as well which would mean an antibiotic is not indicated  You can continue to alternate Ibuprofen and Tylenol for pain management  I am sending in a steroid taper to help with swelling and inflammation. Please take it as directed.  Please be mindful of the following while we go into the weekend  If you have trouble breathing, swallowing, or notice increased swelling please go to the ED  This could be a sign that the infection has gotten worse

## 2021-10-29 ENCOUNTER — Encounter: Payer: Self-pay | Admitting: Physician Assistant

## 2021-10-30 LAB — CULTURE, GROUP A STREP
